# Patient Record
Sex: Female | Born: 1980 | Race: Black or African American | Hispanic: No | Marital: Single | Smoking: Never smoker
Health system: Southern US, Community
[De-identification: ages and names within clinical notes are randomized; demographics above are authoritative.]

## PROBLEM LIST (undated history)

## (undated) HISTORY — PX: OTHER SURGICAL HISTORY: SHX169

---

## 2005-04-30 ENCOUNTER — Emergency Department (HOSPITAL_COMMUNITY): Admission: EM | Admit: 2005-04-30 | Discharge: 2005-04-30 | Payer: Self-pay | Admitting: Emergency Medicine

## 2016-03-27 ENCOUNTER — Encounter: Payer: Self-pay | Admitting: Emergency Medicine

## 2016-03-27 ENCOUNTER — Emergency Department: Payer: BLUE CROSS/BLUE SHIELD

## 2016-03-27 ENCOUNTER — Emergency Department
Admission: EM | Admit: 2016-03-27 | Discharge: 2016-03-27 | Disposition: A | Discharge status: Home / Self Care | Payer: BLUE CROSS/BLUE SHIELD | Attending: Emergency Medicine | Admitting: Emergency Medicine

## 2016-03-27 DIAGNOSIS — Y92014 Private driveway to single-family (private) house as the place of occurrence of the external cause: Secondary | ICD-10-CM | POA: Diagnosis not present

## 2016-03-27 DIAGNOSIS — S99911A Unspecified injury of right ankle, initial encounter: Secondary | ICD-10-CM | POA: Diagnosis present

## 2016-03-27 DIAGNOSIS — W1839XA Other fall on same level, initial encounter: Secondary | ICD-10-CM | POA: Insufficient documentation

## 2016-03-27 DIAGNOSIS — Y999 Unspecified external cause status: Secondary | ICD-10-CM | POA: Diagnosis not present

## 2016-03-27 DIAGNOSIS — Y9301 Activity, walking, marching and hiking: Secondary | ICD-10-CM | POA: Insufficient documentation

## 2016-03-27 DIAGNOSIS — S93491A Sprain of other ligament of right ankle, initial encounter: Secondary | ICD-10-CM | POA: Diagnosis not present

## 2016-03-27 MED ORDER — TRAMADOL HCL 50 MG PO TABS
50.0000 mg | ORAL_TABLET | Freq: Once | ORAL | Status: AC
  Administered 2016-03-27: 50 mg via ORAL

## 2016-03-27 MED ORDER — IBUPROFEN 600 MG PO TABS: 600.0000 mg | ORAL_TABLET | Freq: Four times a day (QID) | ORAL | 0 refills | Status: AC | PRN

## 2016-03-27 MED ORDER — TRAMADOL HCL 50 MG PO TABS: 50.0000 mg | ORAL_TABLET | Freq: Four times a day (QID) | ORAL | 0 refills | Status: AC | PRN

## 2016-03-27 MED ORDER — IBUPROFEN 800 MG PO TABS
800.0000 mg | ORAL_TABLET | Freq: Once | ORAL | Status: AC
  Administered 2016-03-27: 800 mg via ORAL
  Filled 2016-03-27: qty 1

## 2016-03-27 MED ORDER — TRAMADOL HCL 50 MG PO TABS
ORAL_TABLET | ORAL | Status: AC
  Administered 2016-03-27: 50 mg via ORAL
  Filled 2016-03-27: qty 1

## 2016-03-27 NOTE — ED Provider Notes (Signed)
[facility]-EMERGENCY DEPARTMENT Provider Note   CSN: [id number] Arrival date & time: 03/27/16  1557     History   Chief Complaint Chief Complaint  Patient presents with  . Ankle Pain    HPI Alicia Wolfe is a 35 y.o. female presents to the emergency department for evaluation of right foot and ankle pain. Pain began just prior to arrival. Patient states she was walking down her driveway when she rolled her right foot and ankle. She has been unable to walk. Pain is 10 out of 10. She's not had any medications for pain. She denies any head injury, chest pain, shortness of breath. No numbness or tingling.  HPI  History reviewed. No pertinent past medical history.  There are no active problems to display for this patient.   Past Surgical History:  Procedure Laterality Date  . hip replacement      OB History    No data available       Home Medications    Prior to Admission medications   Medication Sig Start Date End Date Taking? Authorizing Provider  ibuprofen (ADVIL,MOTRIN) 600 MG tablet Take 1 tablet (600 mg total) by mouth every 6 (six) hours as needed for moderate pain. 03/27/16   Evon Slackhomas C Rilie Glanz, PA-C  traMADol (ULTRAM) 50 MG tablet Take 1 tablet (50 mg total) by mouth every 6 (six) hours as needed. 03/27/16   Evon Slackhomas C Sonya Gunnoe, PA-C    Family History History reviewed. No pertinent family history.  Social History Social History  Substance Use Topics  . Smoking status: Never Smoker  . Smokeless tobacco: Never Used  . Alcohol use Yes     Allergies   Patient has no known allergies.   Review of Systems Review of Systems  Constitutional: Negative for chills and fever.  HENT: Negative for ear pain and sore throat.   Eyes: Negative for pain and visual disturbance.  Respiratory: Negative for cough and shortness of breath.   Cardiovascular: Negative for chest pain and palpitations.  Gastrointestinal: Negative for abdominal pain and vomiting.  Genitourinary: Negative  for dysuria and hematuria.  Musculoskeletal: Positive for arthralgias and joint swelling. Negative for back pain.  Skin: Negative for color change and rash.  Neurological: Negative for seizures and syncope.  All other systems reviewed and are negative.    Physical Exam Updated Vital Signs BP 133/84   Pulse 73   Temp 98 F (36.7 C) (Oral)   Resp 18   Ht 5\' 5"  (1.651 m)   Wt 99.8 kg   LMP 03/24/2016   SpO2 98%   BMI 36.61 kg/m   Physical Exam  Constitutional: She is oriented to person, place, and time. She appears well-developed and well-nourished. No distress.  HENT:  Head: Normocephalic and atraumatic.  Right Ear: External ear normal.  Left Ear: External ear normal.  Nose: Nose normal.  Mouth/Throat: Oropharynx is clear and moist.  Eyes: Conjunctivae and EOM are normal. Right eye exhibits no discharge. Left eye exhibits no discharge.  Neck: Normal range of motion. Neck supple.  Cardiovascular: Normal rate and regular rhythm.   Pulmonary/Chest: Effort normal and breath sounds normal. No respiratory distress.  Musculoskeletal:  Examination of the right lower extremity shows patient has full range of motion of the right hip with internal rotation with no discomfort. She is able to straight leg raise with the knee has no tenderness to palpation. No swelling throughout the knee. She is tender to palpation throughout the ankle along the medial and lateral malleolus.  She has mild swelling throughout the ankle. She is tender along the base of the fifth metatarsal and midfoot. There is an abrasion to the dorsal aspect of the first metatarsal. Sensation is intact distally. She has 2+ dorsalis pedis pulse. There is no gross deformity noted. She is able to actively plantar flex and dorsiflex.  Neurological: She is alert and oriented to person, place, and time. Coordination normal.  Skin: Skin is warm and dry.  Psychiatric: She has a normal mood and affect. Her behavior is normal. Thought  content normal.  Nursing note and vitals reviewed.    ED Treatments / Results  Labs (all labs ordered are listed, but only abnormal results are displayed) Labs Reviewed - No data to display  EKG  EKG Interpretation None       Radiology Dg Ankle Complete Right  Result Date: 03/27/2016 CLINICAL DATA:  Pt stated she fell today walking down the walkway. Pain in right foot and ankle- pain mostly in ankle. Swelling noted. EXAM: RIGHT ANKLE - COMPLETE 3+ VIEW COMPARISON:  None. FINDINGS: No fracture.  No bone lesion. The ankle mortise is normally spaced and aligned. No arthropathic change. There is soft tissue swelling which predominates laterally. IMPRESSION: No fracture or dislocation. Electronically Signed   By: Amie Portlandavid  Ormond M.D.   On: 03/27/2016 17:00   Dg Foot Complete Right  Result Date: 03/27/2016 CLINICAL DATA:  Pt stated she fell today walking down the walkway. Pain in right foot and ankle- pain mostly in ankle. Swelling noted. EXAM: RIGHT FOOT COMPLETE - 3+ VIEW COMPARISON:  None. FINDINGS: No fracture.  No bone lesion. Joints are normally spaced and aligned.  No arthropathic change. There is soft tissue swelling along the ankle to the midfoot. IMPRESSION: No fracture or dislocation. Electronically Signed   By: Amie Portlandavid  Ormond M.D.   On: 03/27/2016 16:59    Procedures Procedures (including critical care time)  Medications Ordered in ED Medications  ibuprofen (ADVIL,MOTRIN) tablet 800 mg (800 mg Oral Given 03/27/16 1644)     Initial Impression / Assessment and Plan / ED Course  I have reviewed the triage vital signs and the nursing notes.  Pertinent labs & imaging results that were available during my care of the patient were reviewed by me and considered in my medical decision making (see chart for details).  Clinical Course     35 year old female with left ankle sprain. She will rest ice and elevate. X-rays negative for any acute bony abnormality. She is given a walker  and stirrup splint. She'll take ibuprofen and tramadol as needed.  Final Clinical Impressions(s) / ED Diagnoses   Final diagnoses:  Sprain of anterior talofibular ligament of right ankle, initial encounter    New Prescriptions New Prescriptions   IBUPROFEN (ADVIL,MOTRIN) 600 MG TABLET    Take 1 tablet (600 mg total) by mouth every 6 (six) hours as needed for moderate pain.   TRAMADOL (ULTRAM) 50 MG TABLET    Take 1 tablet (50 mg total) by mouth every 6 (six) hours as needed.     Evon Slackhomas C Ibrahima Holberg, PA-C 03/27/16 1754    Sharman CheekPhillip Stafford, MD 03/27/16 2117

## 2016-03-27 NOTE — ED Triage Notes (Signed)
Pt fell walking down driveway. C/o pain to right ankle and right knee.  NAD at this time. Some swelling but no deformity seen in triage.

## 2016-03-27 NOTE — Discharge Instructions (Signed)
Please rest ice and elevate the right lower extremity. Use walker to help with ambulation. Ibuprofen as needed for pain. He may begin weightbearing as tolerated. Follow-up with orthopedics in 5-7 days if no improvement.

## 2017-07-16 IMAGING — DX DG ANKLE COMPLETE 3+V*R*
3 series · 3 of 3 positions shown · non-contrast
Comparison: None.

CLINICAL DATA: Pt stated she fell today walking down the
walkway.... Pain in right foot and ankle- pain mostly in ankle.
Swelling noted.

EXAM:
RIGHT ANKLE - COMPLETE 3+ VIEW

[ankle ap]
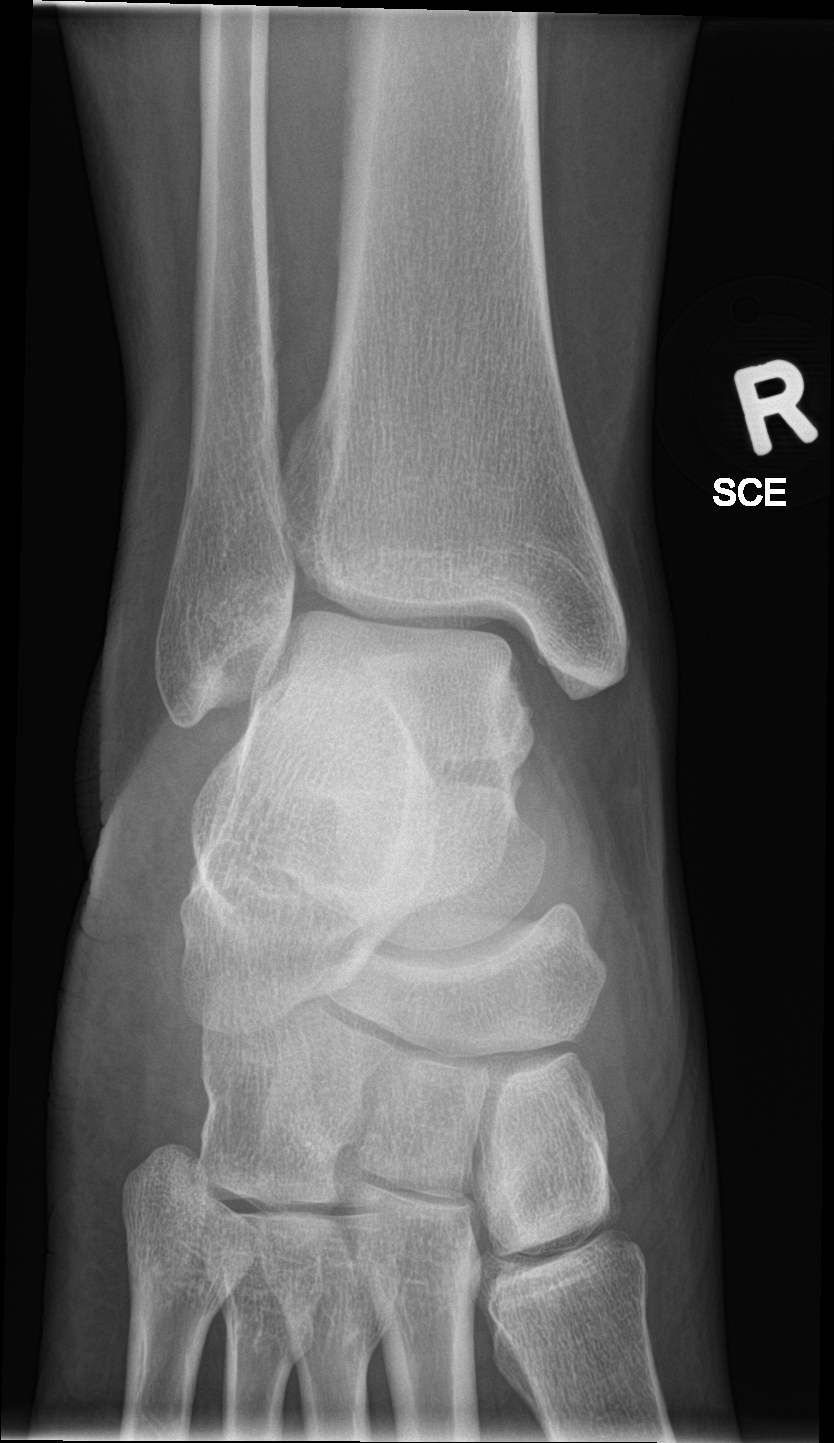

[ankle obl]
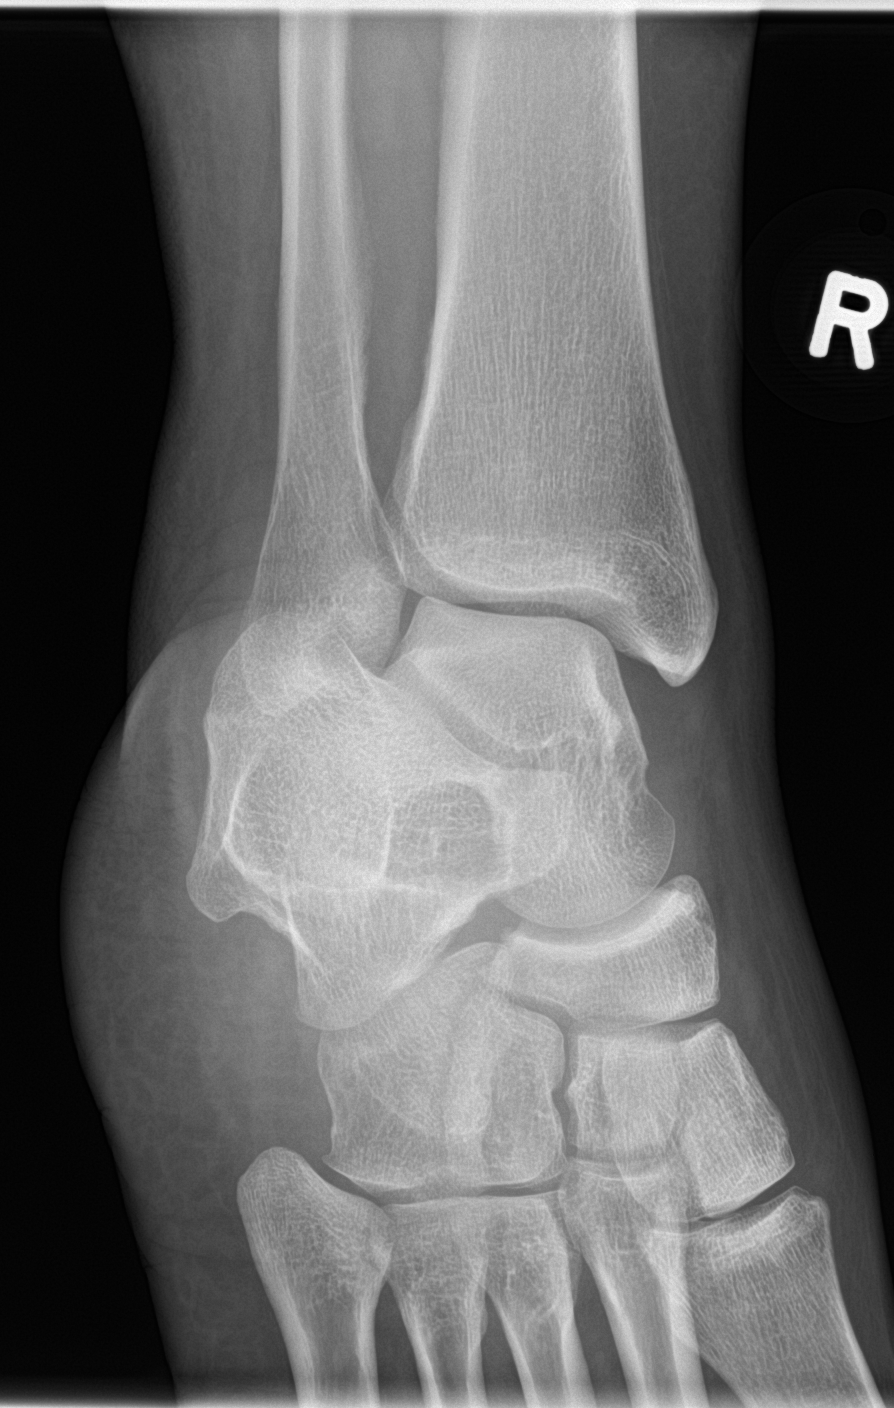

[ankle lat]
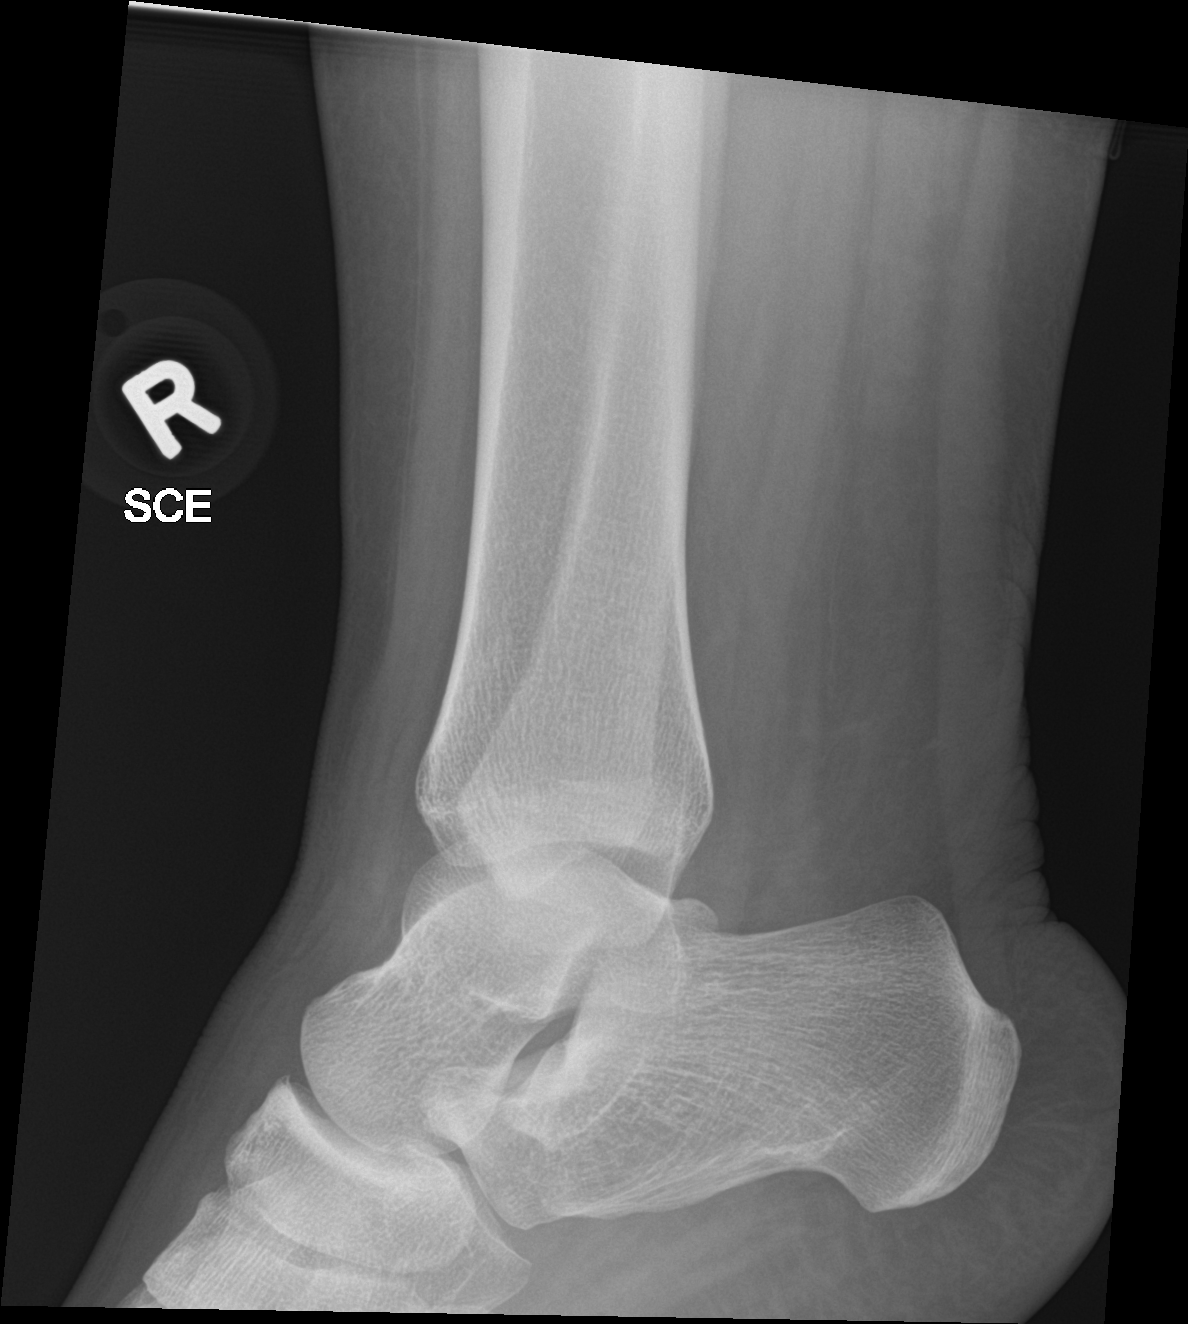

[3 of 3 positions shown; findings below may reference images not displayed]

FINDINGS: No fracture.  No bone lesion.

The ankle mortise is normally spaced and aligned. No arthropathic
change.

There is soft tissue swelling which predominates laterally.
IMPRESSION: No fracture or dislocation.

## 2017-07-16 IMAGING — DX DG FOOT COMPLETE 3+V*R*
3 series · 3 of 3 positions shown · non-contrast
Comparison: None.

CLINICAL DATA: Pt stated she fell today walking down the
walkway.... Pain in right foot and ankle- pain mostly in ankle.
Swelling noted.

EXAM:
RIGHT FOOT COMPLETE - 3+ VIEW

[foot ap]
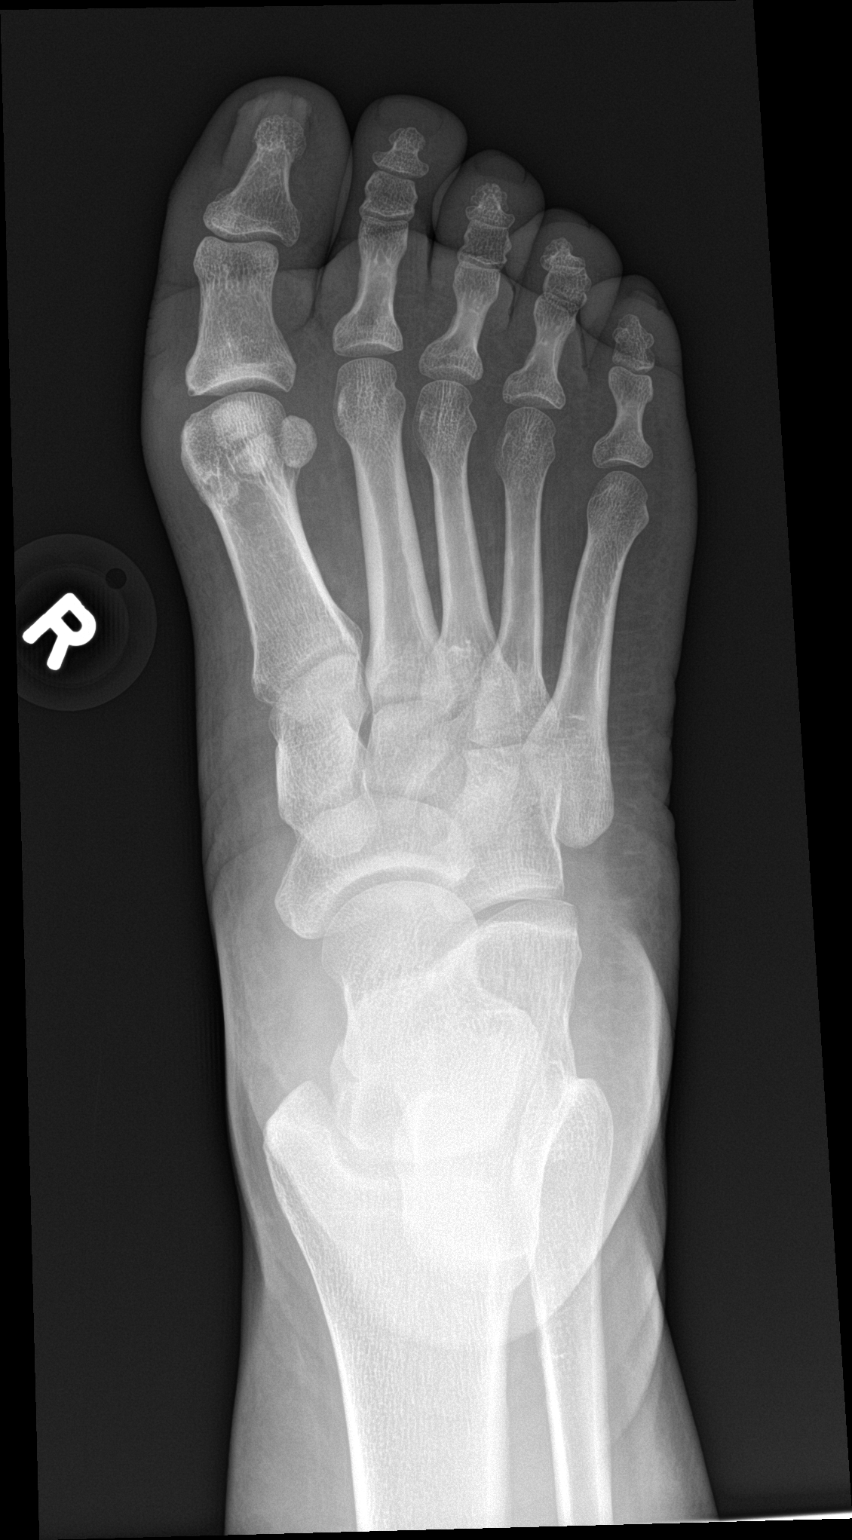

[foot obl]
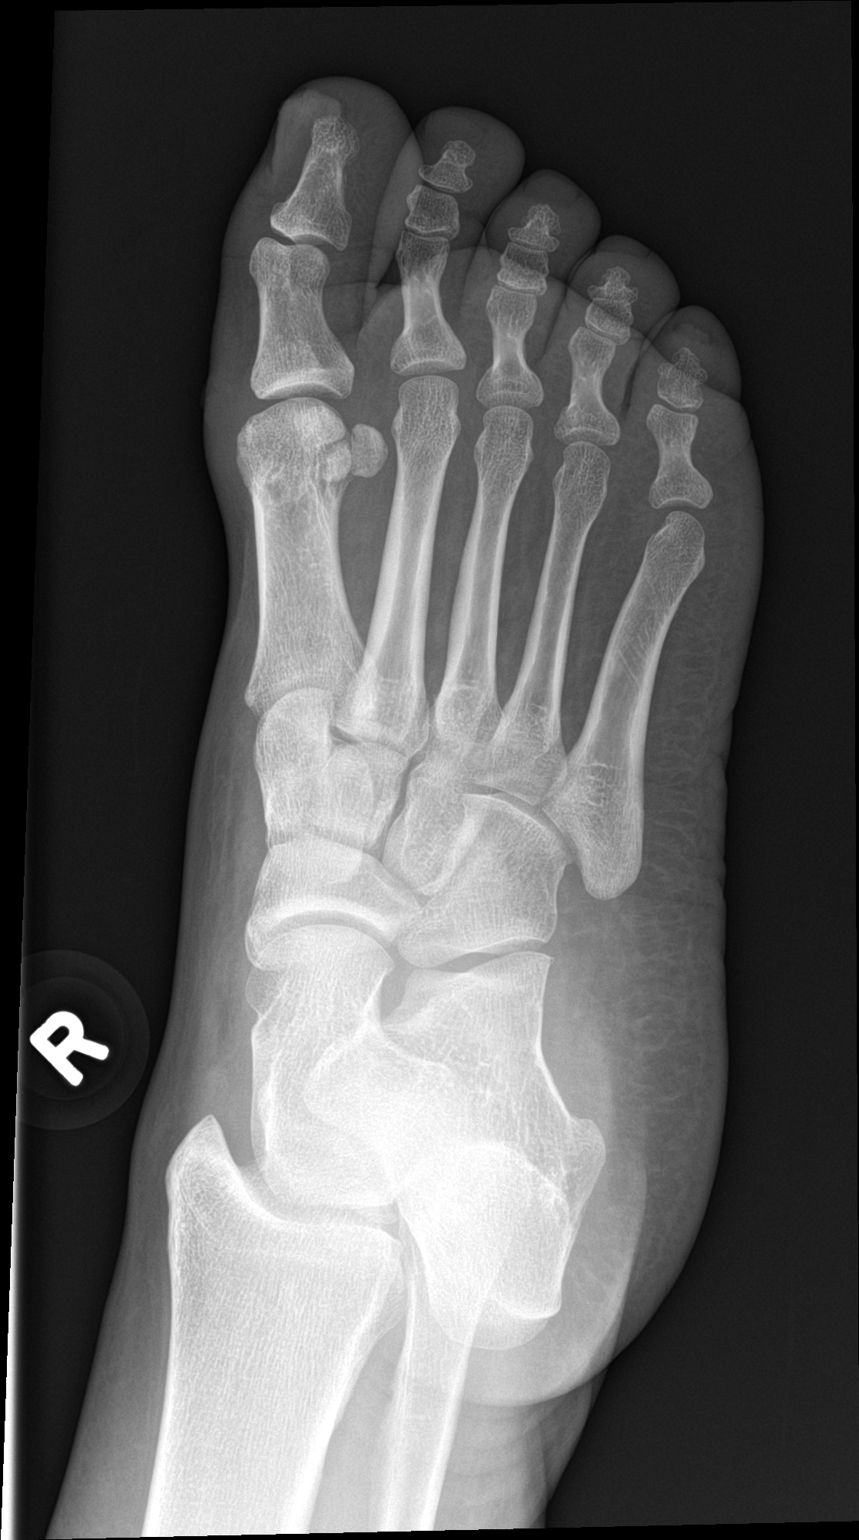

[foot lat]
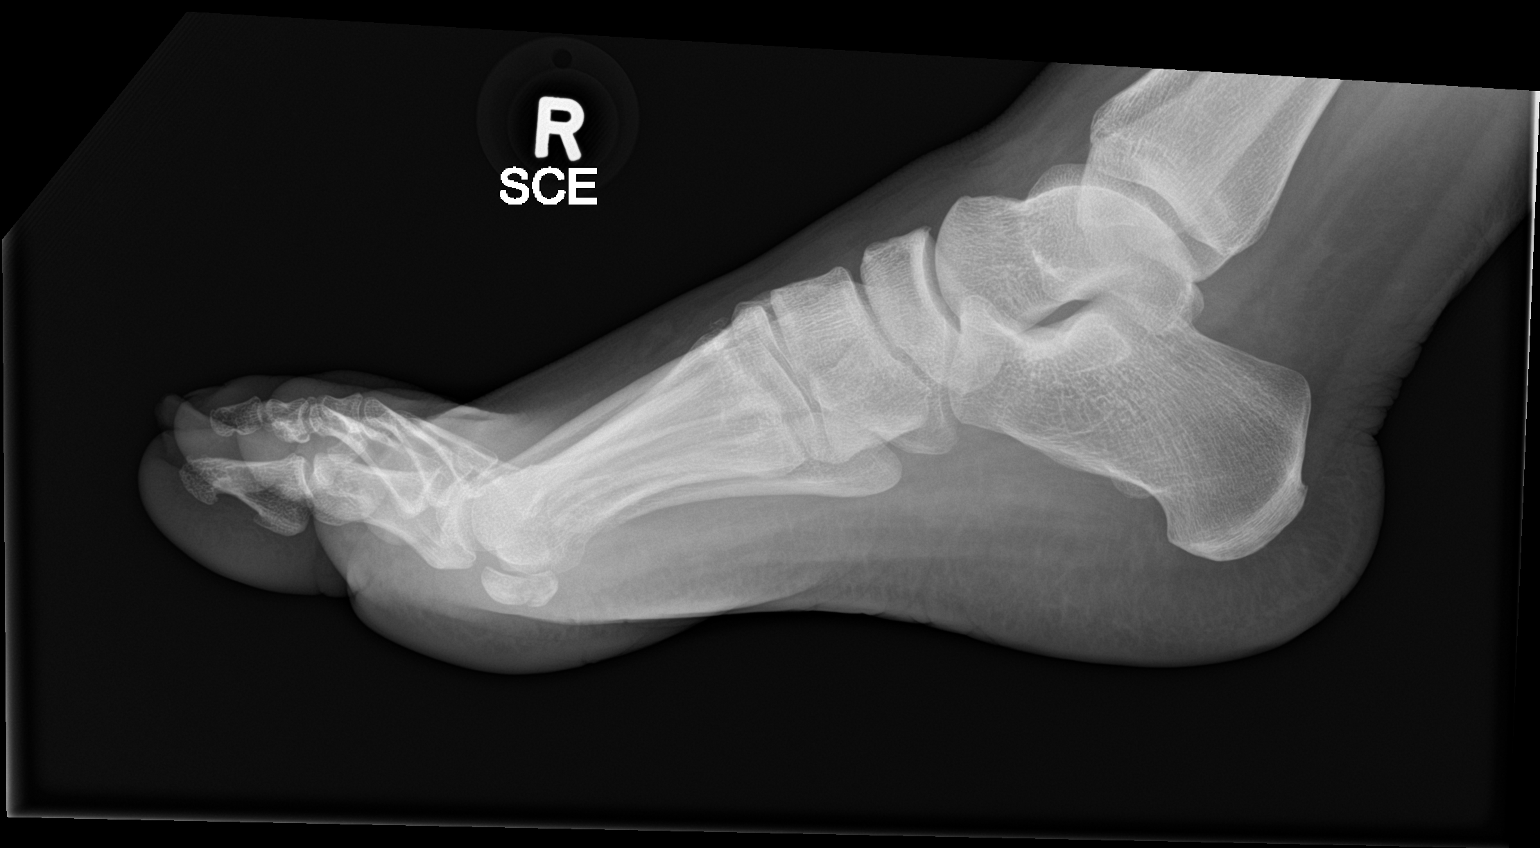

[3 of 3 positions shown; findings below may reference images not displayed]

FINDINGS: No fracture.  No bone lesion.

Joints are normally spaced and aligned.  No arthropathic change.

There is soft tissue swelling along the ankle to the midfoot.
IMPRESSION: No fracture or dislocation.
